# Patient Record
Sex: Female | Born: 1970 | Hispanic: Refuse to answer | Marital: Single | Smoking: Never smoker
Health system: Southern US, Community
[De-identification: ages and names within clinical notes are randomized; demographics above are authoritative.]

## PROBLEM LIST (undated history)

## (undated) DIAGNOSIS — E0500 Thyrotoxicosis with diffuse goiter without thyrotoxic crisis or storm: Secondary | ICD-10-CM

---

## 2019-02-22 DIAGNOSIS — E0500 Thyrotoxicosis with diffuse goiter without thyrotoxic crisis or storm: Secondary | ICD-10-CM | POA: Insufficient documentation

## 2020-05-30 ENCOUNTER — Other Ambulatory Visit: Payer: Self-pay

## 2020-05-30 ENCOUNTER — Emergency Department
Admission: EM | Admit: 2020-05-30 | Discharge: 2020-05-30 | Disposition: A | Discharge status: Home / Self Care | Payer: Self-pay | Source: Home / Self Care

## 2020-05-30 DIAGNOSIS — R058 Other specified cough: Secondary | ICD-10-CM

## 2020-05-30 DIAGNOSIS — J40 Bronchitis, not specified as acute or chronic: Secondary | ICD-10-CM

## 2020-05-30 MED ORDER — GUAIFENESIN-CODEINE 100-10 MG/5ML PO SOLN: 10.0000 mL | Freq: Every evening | ORAL | 0 refills | Status: AC | PRN

## 2020-05-30 MED ORDER — PREDNISONE 20 MG PO TABS: 40.0000 mg | ORAL_TABLET | Freq: Every day | ORAL | 0 refills | Status: AC

## 2020-05-30 MED ORDER — BENZONATATE 100 MG PO CAPS: 200.0000 mg | ORAL_CAPSULE | Freq: Three times a day (TID) | ORAL | 0 refills | Status: AC | PRN

## 2020-05-30 NOTE — Discharge Instructions (Signed)
Start prednisone 40 mg once daily x5 days.  For daytime cough I have prescribed you 200 mg of benzonatate may take every 8 hours as needed for cough.  For management of nighttime cough I have prescribed you guaifenesin with codeine you may take 10 mL at bedtime for management of cough.  Cough can last for up to 10 to 14 days following Covid however following completion of prednisone and management of cough with prescribed medication the frequency of your cough and intensity should greatly improve.

## 2020-05-30 NOTE — ED Triage Notes (Signed)
Patient presents to Urgent Care with complaints of dry cough since about 3 weeks ago. Patient reports she had covid 2 weeks ago and the cough has just not gone away, cannot sleep due to cough and is frustrated.

## 2020-06-12 ENCOUNTER — Encounter: Payer: Self-pay | Admitting: Emergency Medicine

## 2020-06-12 ENCOUNTER — Emergency Department
Admission: EM | Admit: 2020-06-12 | Discharge: 2020-06-12 | Disposition: A | Discharge status: Home / Self Care | Payer: Self-pay | Source: Home / Self Care

## 2020-06-12 ENCOUNTER — Other Ambulatory Visit: Payer: Self-pay

## 2020-06-12 DIAGNOSIS — R590 Localized enlarged lymph nodes: Secondary | ICD-10-CM

## 2020-06-12 DIAGNOSIS — E0500 Thyrotoxicosis with diffuse goiter without thyrotoxic crisis or storm: Secondary | ICD-10-CM

## 2020-06-12 DIAGNOSIS — Z8616 Personal history of COVID-19: Secondary | ICD-10-CM

## 2020-06-12 DIAGNOSIS — R5383 Other fatigue: Secondary | ICD-10-CM

## 2020-06-12 HISTORY — DX: Thyrotoxicosis with diffuse goiter without thyrotoxic crisis or storm: E05.00

## 2020-06-12 NOTE — Discharge Instructions (Signed)
  You will be notified in 1-2 days of your lab results if abnormal. It is recommended you establish care with a primary care provider for ongoing healthcare needs and recheck of symptoms later this week if not improving.

## 2020-06-12 NOTE — ED Provider Notes (Signed)
[facility]    CSN: [id number] Arrival date & time: 06/12/20  1434      History   Chief Complaint Chief Complaint  Patient presents with  . Sore Throat    HPI Brooke Andrade is a 50 y.o. female.   HPI Brooke Andrade is a 50 y.o. female presenting to UC with c/o continued mild cough since being diagnoses with COVID last month.  She has since been on prednisone with mild relief. She is also c/o sore throat and swollen lymph nodes in her neck that she noticed last night. Pain has improved today but pt concerned about the swollen lymph nodes. Hx of Graves disease but is not on medication. Has not seen a provider for her Grave's disease in several years.  Denies fever, chills, n/v/d. No chest pain or SOB. No known sick contacts.    Past Medical History:  Diagnosis Date  . Graves disease     Patient Active Problem List   Diagnosis Date Noted  . Graves disease 02/22/2019    History reviewed. No pertinent surgical history.  OB History   No obstetric history on file.      Home Medications    Prior to Admission medications   Medication Sig Start Date End Date Taking? Authorizing Provider  benzonatate (TESSALON) 100 MG capsule Take 2 capsules (200 mg total) by mouth 3 (three) times daily as needed for cough. 05/30/20  Yes Bing Neighbors, FNP  predniSONE (DELTASONE) 20 MG tablet Take 2 tablets (40 mg total) by mouth daily with breakfast. Patient not taking: Reported on 06/12/2020 05/30/20   Bing Neighbors, FNP    Family History Family History  Problem Relation Age of Onset  . Hypertension Mother   . Diabetes Father   . Hypertension Father   . Heart failure Father     Social History Social History   Tobacco Use  . Smoking status: Never Smoker  . Smokeless tobacco: Never Used  Vaping Use  . Vaping Use: Never used  Substance Use Topics  . Alcohol use: Not Currently  . Drug use: Never     Allergies   Patient has no known  allergies.   Review of Systems Review of Systems  Constitutional: Positive for fatigue. Negative for chills and fever.  HENT: Positive for congestion and sore throat. Negative for ear pain, trouble swallowing and voice change.   Respiratory: Positive for cough. Negative for shortness of breath.   Cardiovascular: Negative for chest pain and palpitations.  Gastrointestinal: Negative for abdominal pain, diarrhea, nausea and vomiting.  Musculoskeletal: Negative for arthralgias, back pain and myalgias.  Skin: Negative for rash.  Neurological: Negative for dizziness, light-headedness and headaches.  All other systems reviewed and are negative.    Physical Exam Triage Vital Signs ED Triage Vitals  Enc Vitals Group     BP 06/12/20 1506 118/83     Pulse Rate 06/12/20 1506 69     Resp 06/12/20 1506 16     Temp 06/12/20 1506 98.3 F (36.8 C)     Temp Source 06/12/20 1506 Oral     SpO2 06/12/20 1506 100 %     Weight 06/12/20 1507 145 lb (65.8 kg)     Height 06/12/20 1507 5\' 6"  (1.676 m)     Head Circumference --      Peak Flow --      Pain Score 06/12/20 1507 5     Pain Loc --      Pain Edu? --  Excl. in GC? --    No data found.  Updated Vital Signs BP 118/83 (BP Location: Right Arm)   Pulse 69   Temp 98.3 F (36.8 C) (Oral)   Resp 16   Ht 5\' 6"  (1.676 m)   Wt 145 lb (65.8 kg)   SpO2 100%   BMI 23.40 kg/m   Visual Acuity Right Eye Distance:   Left Eye Distance:   Bilateral Distance:    Right Eye Near:   Left Eye Near:    Bilateral Near:     Physical Exam Vitals and nursing note reviewed.  Constitutional:      General: She is not in acute distress.    Appearance: She is well-developed and well-nourished. She is not ill-appearing, toxic-appearing or diaphoretic.  HENT:     Head: Normocephalic and atraumatic.     Right Ear: Tympanic membrane and ear canal normal.     Left Ear: Tympanic membrane and ear canal normal.     Nose: Nose normal.     Right Sinus: No  maxillary sinus tenderness or frontal sinus tenderness.     Left Sinus: No maxillary sinus tenderness or frontal sinus tenderness.     Mouth/Throat:     Lips: Pink.     Mouth: Mucous membranes are moist.     Pharynx: Oropharynx is clear. Uvula midline. No pharyngeal swelling, oropharyngeal exudate, posterior oropharyngeal erythema or uvula swelling.  Eyes:     Extraocular Movements: EOM normal.  Cardiovascular:     Rate and Rhythm: Normal rate and regular rhythm.  Pulmonary:     Effort: Pulmonary effort is normal. No respiratory distress.     Breath sounds: Normal breath sounds. No stridor. No wheezing, rhonchi or rales.  Musculoskeletal:        General: Normal range of motion.     Cervical back: Normal range of motion and neck supple.  Lymphadenopathy:     Cervical: Cervical adenopathy present.  Skin:    General: Skin is warm and dry.  Neurological:     Mental Status: She is alert and oriented to person, place, and time.  Psychiatric:        Mood and Affect: Mood and affect normal.        Behavior: Behavior normal.      UC Treatments / Results  Labs (all labs ordered are listed, but only abnormal results are displayed) Labs Reviewed  CBC WITH DIFFERENTIAL/PLATELET  BASIC METABOLIC PANEL  TSH    EKG   Radiology No results found.  Procedures Procedures (including critical care time)  Medications Ordered in UC Medications - No data to display  Initial Impression / Assessment and Plan / UC Course  I have reviewed the triage vital signs and the nursing notes.  Pertinent labs & imaging results that were available during my care of the patient were reviewed by me and considered in my medical decision making (see chart for details).     CBC, BMP and TSH pending No evidence of bacterial infection at this time Encouraged symptomatic tx F/u with PCP later this week or next, and [facility] Care Clinic as needed Resource guide provided  Final Clinical Impressions(s) / UC  Diagnoses   Final diagnoses:  Graves disease  Fatigue, unspecified type  Cervical lymphadenopathy  History of COVID-19     Discharge Instructions      You will be notified in 1-2 days of your lab results if abnormal. It is recommended you establish care with a primary care  provider for ongoing healthcare needs and recheck of symptoms later this week if not improving.    ED Prescriptions    None     PDMP not reviewed this encounter.   Lurene Shadow, [place] 06/12/20 1657

## 2020-06-12 NOTE — ED Triage Notes (Signed)
Continues w/ ongoing cough  Noticed lymph nodes were swollen last night and throat was sore - hurt to swallow  Swelling has gone down today - able to swallow OTC ibuprofen - none today  COVID in the beginning of Jan COVID vaccine in May 2021- no booster

## 2020-06-13 LAB — CBC WITH DIFFERENTIAL/PLATELET
Absolute Monocytes: 380 cells/uL (ref 200–950)
Basophils Absolute: 29 cells/uL (ref 0–200)
Basophils Relative: 0.4 %
Eosinophils Absolute: 124 cells/uL (ref 15–500)
Eosinophils Relative: 1.7 %
HCT: 41.6 % (ref 35.0–45.0)
Hemoglobin: 13.4 g/dL (ref 11.7–15.5)
Lymphs Abs: 2811 cells/uL (ref 850–3900)
MCH: 25.9 pg — ABNORMAL LOW (ref 27.0–33.0)
MCHC: 32.2 g/dL (ref 32.0–36.0)
MCV: 80.5 fL (ref 80.0–100.0)
MPV: 10.2 fL (ref 7.5–12.5)
Monocytes Relative: 5.2 %
Neutro Abs: 3957 cells/uL (ref 1500–7800)
Neutrophils Relative %: 54.2 %
Platelets: 260 10*3/uL (ref 140–400)
RBC: 5.17 10*6/uL — ABNORMAL HIGH (ref 3.80–5.10)
RDW: 14.1 % (ref 11.0–15.0)
Total Lymphocyte: 38.5 %
WBC: 7.3 10*3/uL (ref 3.8–10.8)

## 2020-06-13 LAB — BASIC METABOLIC PANEL
BUN: 8 mg/dL (ref 7–25)
CO2: 29 mmol/L (ref 20–32)
Calcium: 9.5 mg/dL (ref 8.6–10.4)
Chloride: 106 mmol/L (ref 98–110)
Creat: 0.9 mg/dL (ref 0.50–1.05)
Glucose, Bld: 101 mg/dL — ABNORMAL HIGH (ref 65–99)
Potassium: 3.8 mmol/L (ref 3.5–5.3)
Sodium: 143 mmol/L (ref 135–146)

## 2020-06-13 LAB — TSH: TSH: 0.68 mIU/L

## 2020-06-14 ENCOUNTER — Telehealth: Payer: Self-pay

## 2020-06-14 NOTE — Telephone Encounter (Signed)
Pt came in to get lab results. Results printed and all questions answered.

## 2020-06-28 ENCOUNTER — Emergency Department (INDEPENDENT_AMBULATORY_CARE_PROVIDER_SITE_OTHER)
Admission: EM | Admit: 2020-06-28 | Discharge: 2020-06-28 | Disposition: A | Discharge status: Home / Self Care | Payer: Self-pay | Source: Home / Self Care

## 2020-06-28 ENCOUNTER — Emergency Department (INDEPENDENT_AMBULATORY_CARE_PROVIDER_SITE_OTHER): Payer: Self-pay

## 2020-06-28 DIAGNOSIS — S8262XA Displaced fracture of lateral malleolus of left fibula, initial encounter for closed fracture: Secondary | ICD-10-CM

## 2020-06-28 DIAGNOSIS — S8265XA Nondisplaced fracture of lateral malleolus of left fibula, initial encounter for closed fracture: Secondary | ICD-10-CM

## 2020-06-28 MED ORDER — ACETAMINOPHEN 325 MG PO TABS
650.0000 mg | ORAL_TABLET | Freq: Once | ORAL | Status: AC
  Administered 2020-06-28: 650 mg via ORAL

## 2020-06-28 MED ORDER — NAPROXEN 375 MG PO TABS: 375.0000 mg | ORAL_TABLET | Freq: Two times a day (BID) | ORAL | 0 refills | Status: AC

## 2020-06-28 MED ORDER — CYCLOBENZAPRINE HCL 10 MG PO TABS: 10.0000 mg | ORAL_TABLET | Freq: Two times a day (BID) | ORAL | 0 refills | Status: AC | PRN

## 2020-06-28 NOTE — ED Provider Notes (Signed)
[facility]    CSN: [id number] Arrival date & time: 06/28/20  1549      History   Chief Complaint Chief Complaint  Patient presents with  . Ankle Pain    LT    HPI Brooke Andrade is a 50 y.o. female.   HPI Patient presents today with left ankle injury. She was walking out of her front door and subsequently rolled her left ankle and felt a popped. She reports gradually worsening swelling and pain over the course of the day. Pain is worse with weightbearing. No prior fractures involving the left ankle. She has taken tylenol without relief of pain. Past Medical History:  Diagnosis Date  . Graves disease     Patient Active Problem List   Diagnosis Date Noted  . Graves disease 02/22/2019    History reviewed. No pertinent surgical history.  OB History   No obstetric history on file.      Home Medications    Prior to Admission medications   Medication Sig Start Date End Date Taking? Authorizing Provider  benzonatate (TESSALON) 100 MG capsule Take 2 capsules (200 mg total) by mouth 3 (three) times daily as needed for cough. 05/30/20   Bing Neighbors, FNP  predniSONE (DELTASONE) 20 MG tablet Take 2 tablets (40 mg total) by mouth daily with breakfast. Patient not taking: Reported on 06/12/2020 05/30/20   Bing Neighbors, FNP    Family History Family History  Problem Relation Age of Onset  . Hypertension Mother   . Diabetes Father   . Hypertension Father   . Heart failure Father     Social History Social History   Tobacco Use  . Smoking status: Never Smoker  . Smokeless tobacco: Never Used  Vaping Use  . Vaping Use: Never used  Substance Use Topics  . Alcohol use: Not Currently  . Drug use: Never     Allergies   Patient has no known allergies.   Review of Systems Review of Systems Pertinent negatives listed in HPI   Physical Exam Triage Vital Signs ED Triage Vitals  Enc Vitals Group     BP 06/28/20 1607 116/73      Pulse Rate 06/28/20 1607 74     Resp 06/28/20 1607 17     Temp 06/28/20 1607 98.3 F (36.8 C)     Temp Source 06/28/20 1607 Oral     SpO2 06/28/20 1607 99 %     Weight --      Height --      Head Circumference --      Peak Flow --      Pain Score 06/28/20 1608 6     Pain Loc --      Pain Edu? --      Excl. in GC? --    No data found.  Updated Vital Signs BP 116/73 (BP Location: Right Arm)   Pulse 74   Temp 98.3 F (36.8 C) (Oral)   Resp 17   SpO2 99%   Visual Acuity Right Eye Distance:   Left Eye Distance:   Bilateral Distance:    Right Eye Near:   Left Eye Near:    Bilateral Near:     Physical Exam General appearance: Alert, well developed, well nourished, cooperative and in no distress Head: Normocephalic, without obvious abnormality, atraumatic Respiratory: Respirations even and unlabored, normal respiratory rate Heart: Rate and rhythm normal.  Extremities:Left ankle: Swelling lateral malleolus, no AC pain. No ecchymosis  Skin: Skin color,  texture, turgor normal. No rashes seen  Psych: Appropriate mood and affect. UC Treatments / Results  Labs (all labs ordered are listed, but only abnormal results are displayed) Labs Reviewed - No data to display  EKG   Radiology DG Ankle Complete Left  Result Date: 06/28/2020 CLINICAL DATA:  Injury, lateral pain EXAM: LEFT ANKLE COMPLETE - 3+ VIEW COMPARISON:  None. FINDINGS: On the oblique view, there is a subtle nondisplaced lateral malleolar tip fracture suspected. Distal tibia, talus and calcaneus appear intact. No joint abnormality. IMPRESSION: Findings suspicious for a subtle nondisplaced lateral malleolar tip fracture. Electronically Signed   By: Judie Petit.  Shick M.D.   On: 06/28/2020 16:58    Procedures Procedures (including critical care time)  Medications Ordered in UC Medications  acetaminophen (TYLENOL) tablet 650 mg (650 mg Oral Given 06/28/20 1619)    Initial Impression / Assessment and Plan / UC Course  I  have reviewed the triage vital signs and the nursing notes.  Pertinent labs & imaging results that were available during my care of the patient were reviewed by me and considered in my medical decision making (see chart for details).    Left ankle injury resulting in nondisplaced malleolus fracture tip of tibia Ankle lace up for immobilization. Naprosyn and Cyclobenzaprine for pain. Continue RICE . Follow-up with sports medicine provider listed. Return precautions given. Final Clinical Impressions(s) / UC Diagnoses   Final diagnoses:  Closed fracture of distal lateral malleolus of left fibula, initial encounter     Discharge Instructions     Start naproxen 500 mg twice daily to relieve inflammation and swelling associated with pain. For acute pain I have prescribed you cyclobenzaprine you may take 1 to 2 tablets twice daily as needed for pain.  You may also take Tylenol 500 mg every 4-6 hours as needed for pain. If pain worsens or does not improve I have included information to follow-up with a sports medicine specialist for further evaluation of fracture. You will need to remain in an ankle lace up for at least 4 to 6 weeks.  When you are able to ambulate without pain or weakness of the ankle is an indication that your injury has healed.   ED Prescriptions    Medication Sig Dispense Auth. Provider   cyclobenzaprine (FLEXERIL) 10 MG tablet Take 1 tablet (10 mg total) by mouth 2 (two) times daily as needed for muscle spasms. 20 tablet Bing Neighbors, FNP   naproxen (NAPROSYN) 375 MG tablet Take 1 tablet (375 mg total) by mouth 2 (two) times daily. 20 tablet Bing Neighbors, FNP     PDMP not reviewed this encounter.   Bing Neighbors, [place] 06/30/20 2031

## 2020-06-28 NOTE — Discharge Instructions (Signed)
Start naproxen 500 mg twice daily to relieve inflammation and swelling associated with pain. For acute pain I have prescribed you cyclobenzaprine you may take 1 to 2 tablets twice daily as needed for pain.  You may also take Tylenol 500 mg every 4-6 hours as needed for pain. If pain worsens or does not improve I have included information to follow-up with a sports medicine specialist for further evaluation of fracture. You will need to remain in an ankle lace up for at least 4 to 6 weeks.  When you are able to ambulate without pain or weakness of the ankle is an indication that your injury has healed.

## 2020-06-28 NOTE — ED Triage Notes (Signed)
Pt c/o LT ankle pain since today at 1230 when she rolled ankle on front steps of her house. Says she heard a pop. Pain 6/10 Iced after. No motrin or tylenol taken.

## 2022-06-03 IMAGING — DX DG ANKLE COMPLETE 3+V*L*
3 series · 3 of 3 positions shown · non-contrast
Comparison: None.

CLINICAL DATA: Injury, lateral pain

EXAM:
LEFT ANKLE COMPLETE - 3+ VIEW

[ankle ap]
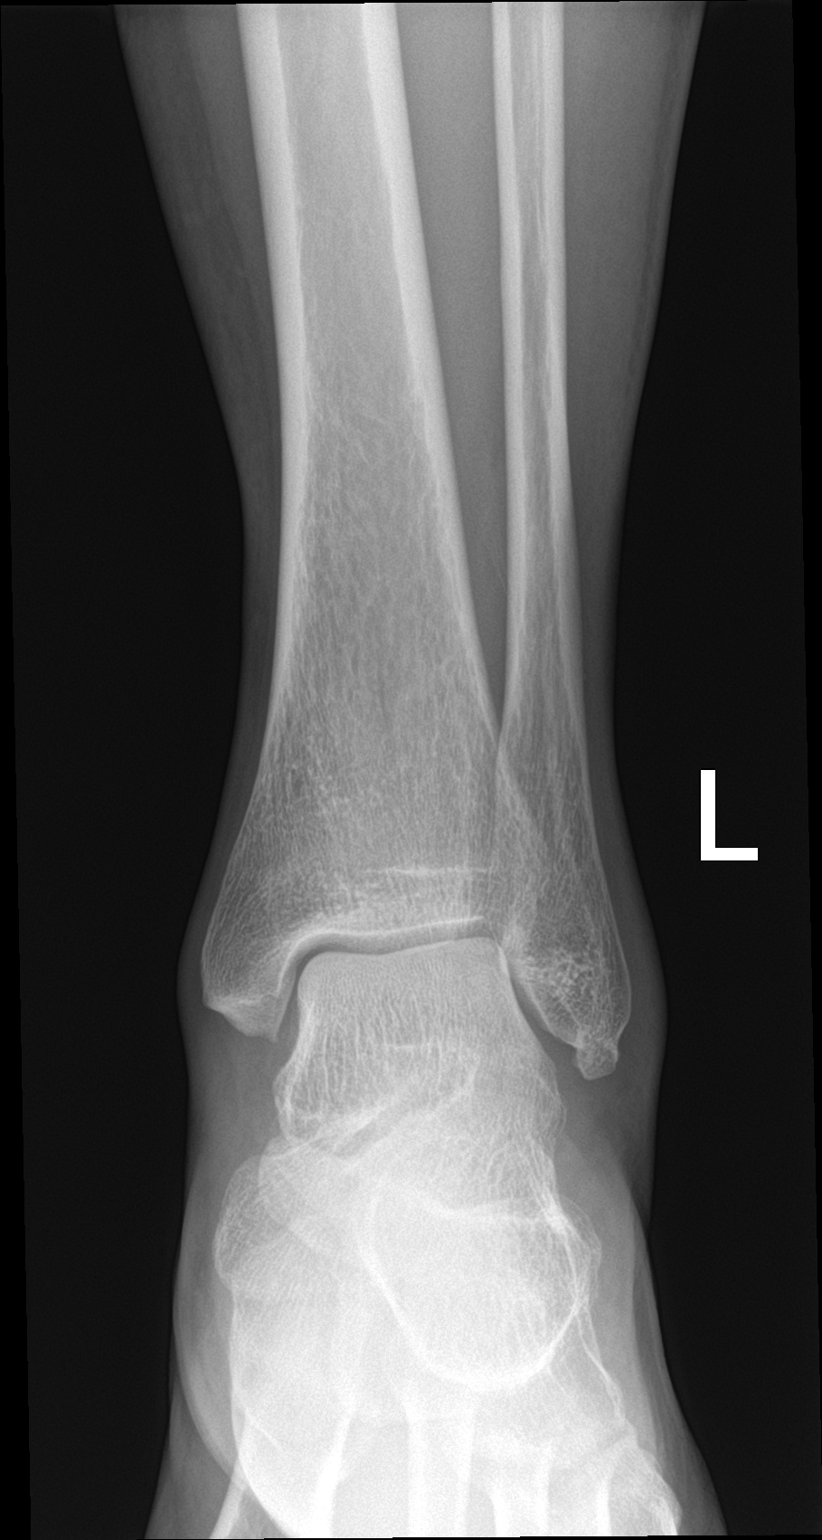

[ankle obl]
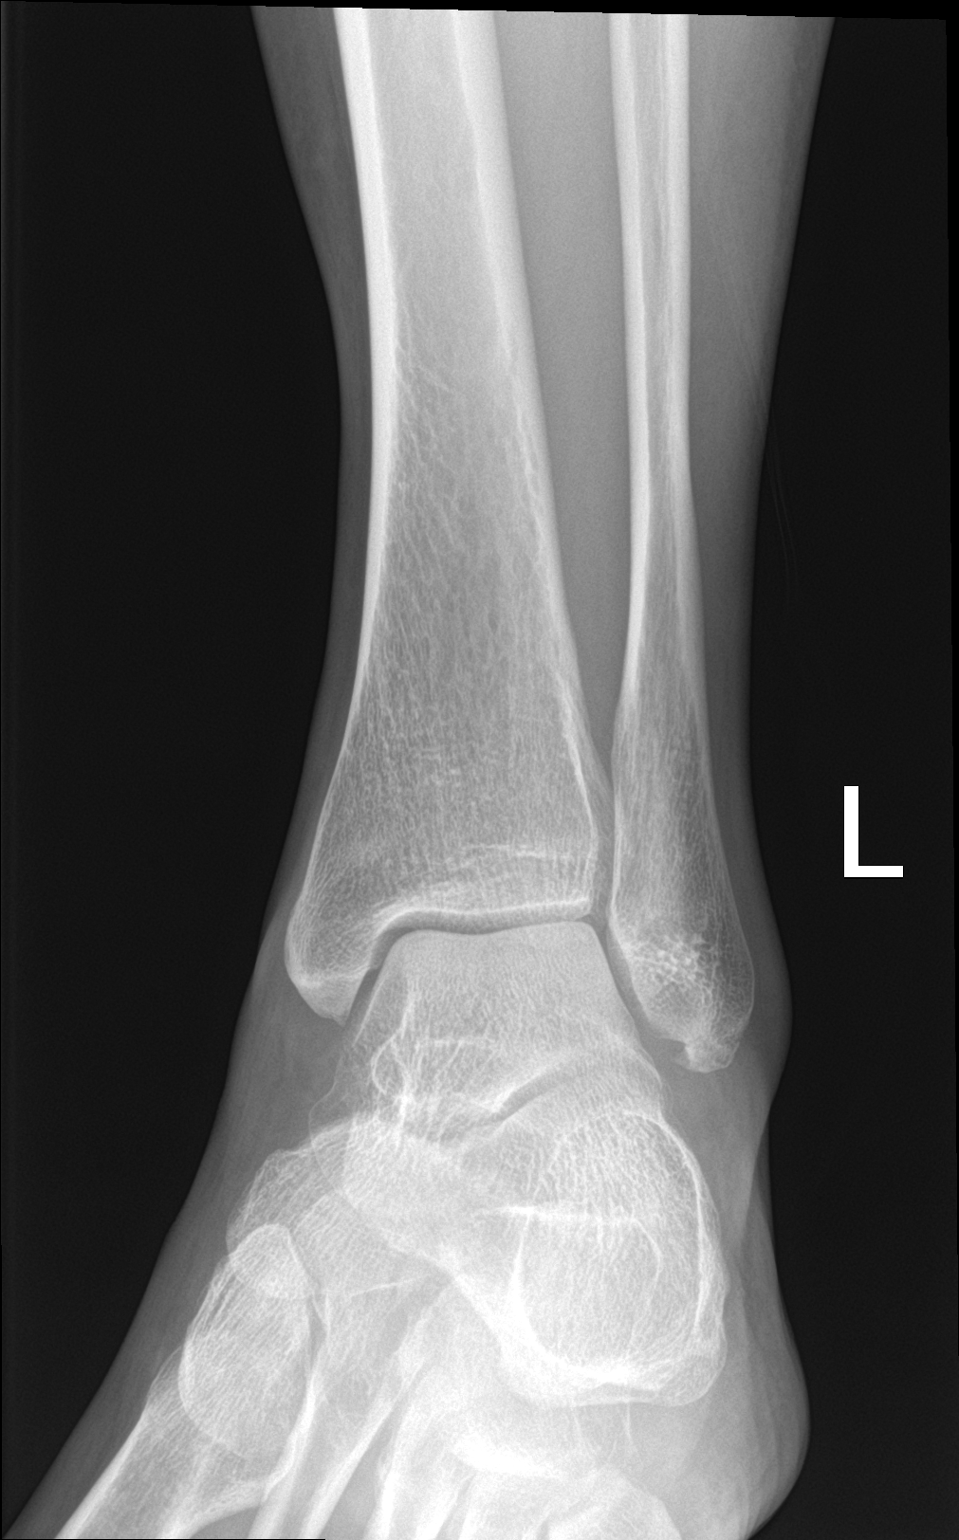

[ankle lat]
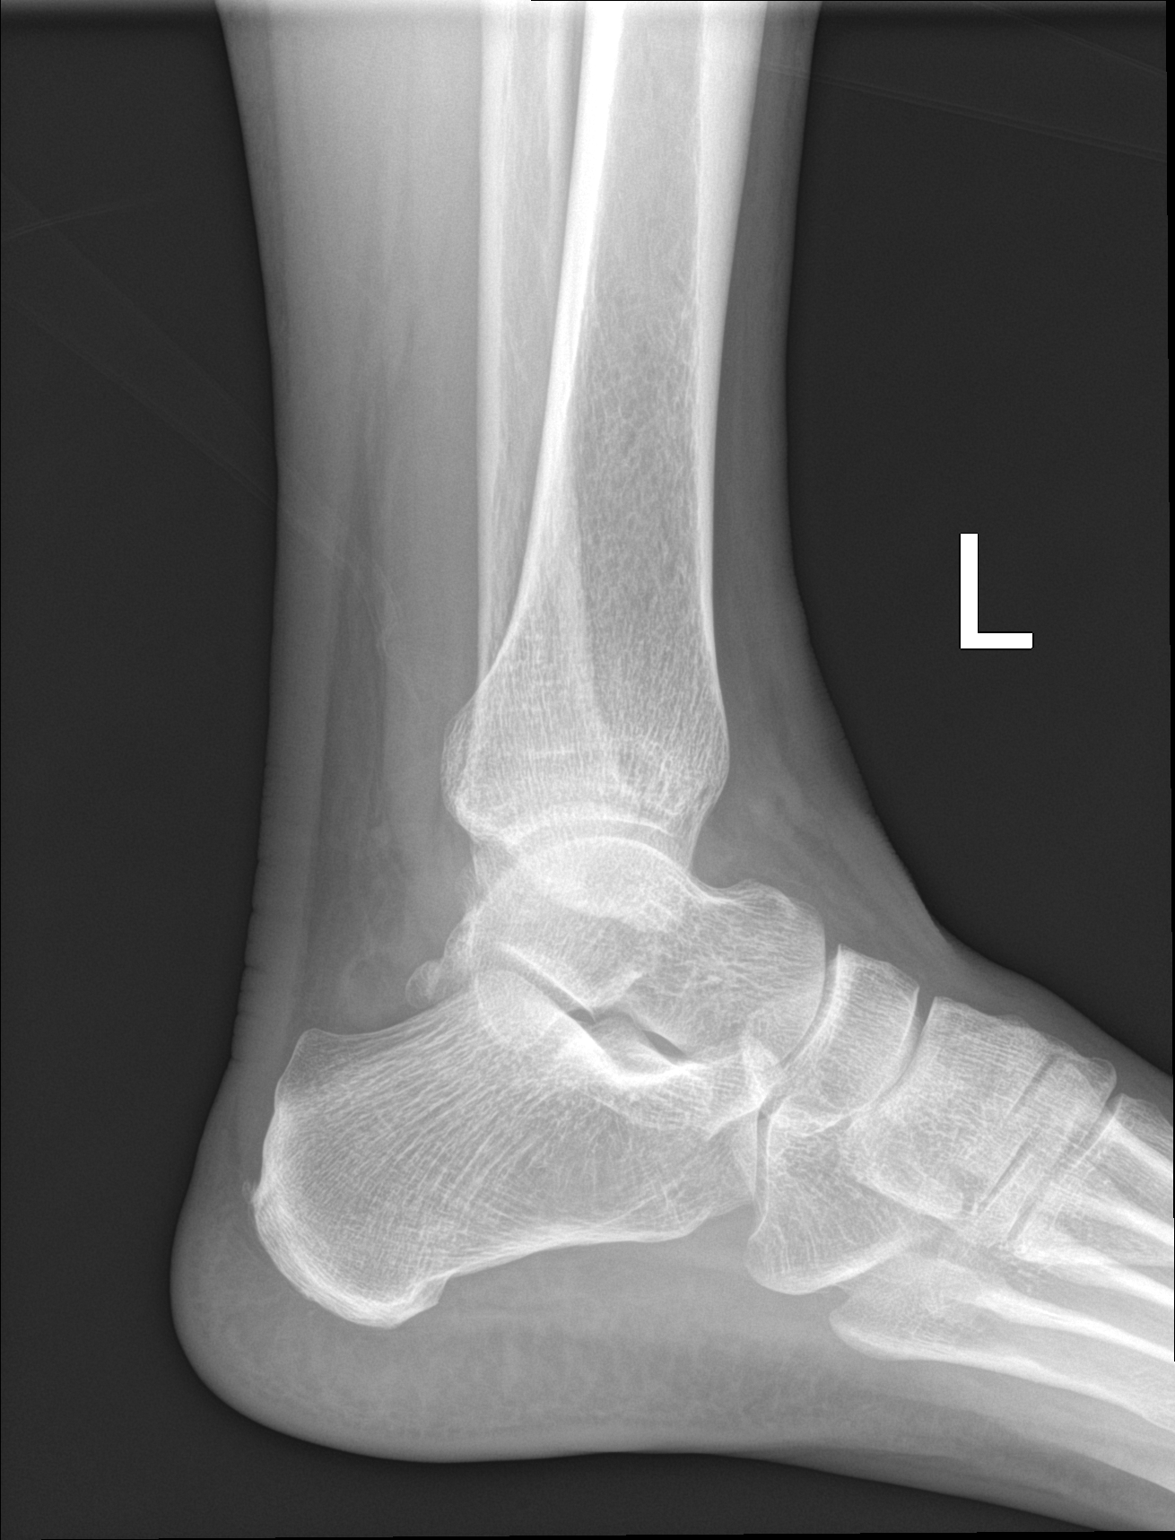

[3 of 3 positions shown; findings below may reference images not displayed]

FINDINGS: On the oblique view, there is a subtle nondisplaced lateral
malleolar tip fracture suspected. Distal tibia, talus and calcaneus
appear intact. No joint abnormality.
IMPRESSION: Findings suspicious for a subtle nondisplaced lateral malleolar tip
fracture.
